# Patient Record
Sex: Female | Born: 2000 | Hispanic: Yes | Marital: Single | Smoking: Never smoker
Health system: Southern US, Community
[De-identification: ages and names within clinical notes are randomized; demographics above are authoritative.]

---
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
## 2022-01-12 DIAGNOSIS — Z419 Encounter for procedure for purposes other than remedying health state, unspecified: Secondary | ICD-10-CM | POA: Diagnosis not present

## 2022-02-12 DIAGNOSIS — Z419 Encounter for procedure for purposes other than remedying health state, unspecified: Secondary | ICD-10-CM | POA: Diagnosis not present

## 2022-03-14 DIAGNOSIS — Z419 Encounter for procedure for purposes other than remedying health state, unspecified: Secondary | ICD-10-CM | POA: Diagnosis not present

## 2022-04-03 DIAGNOSIS — H5213 Myopia, bilateral: Secondary | ICD-10-CM | POA: Diagnosis not present

## 2022-04-14 DIAGNOSIS — Z419 Encounter for procedure for purposes other than remedying health state, unspecified: Secondary | ICD-10-CM | POA: Diagnosis not present

## 2022-05-15 DIAGNOSIS — Z419 Encounter for procedure for purposes other than remedying health state, unspecified: Secondary | ICD-10-CM | POA: Diagnosis not present

## 2022-06-13 DIAGNOSIS — Z419 Encounter for procedure for purposes other than remedying health state, unspecified: Secondary | ICD-10-CM | POA: Diagnosis not present

## 2022-07-14 DIAGNOSIS — Z419 Encounter for procedure for purposes other than remedying health state, unspecified: Secondary | ICD-10-CM | POA: Diagnosis not present

## 2022-08-13 DIAGNOSIS — Z419 Encounter for procedure for purposes other than remedying health state, unspecified: Secondary | ICD-10-CM | POA: Diagnosis not present

## 2022-08-19 ENCOUNTER — Telehealth: Payer: Self-pay

## 2022-08-19 NOTE — Telephone Encounter (Signed)
Spoke with patient who declined to schedule. AS, CMA

## 2022-09-13 DIAGNOSIS — Z419 Encounter for procedure for purposes other than remedying health state, unspecified: Secondary | ICD-10-CM | POA: Diagnosis not present

## 2022-10-13 DIAGNOSIS — Z419 Encounter for procedure for purposes other than remedying health state, unspecified: Secondary | ICD-10-CM | POA: Diagnosis not present

## 2022-11-13 DIAGNOSIS — Z419 Encounter for procedure for purposes other than remedying health state, unspecified: Secondary | ICD-10-CM | POA: Diagnosis not present

## 2022-12-14 DIAGNOSIS — Z419 Encounter for procedure for purposes other than remedying health state, unspecified: Secondary | ICD-10-CM | POA: Diagnosis not present

## 2023-01-13 DIAGNOSIS — Z419 Encounter for procedure for purposes other than remedying health state, unspecified: Secondary | ICD-10-CM | POA: Diagnosis not present

## 2023-02-13 DIAGNOSIS — Z419 Encounter for procedure for purposes other than remedying health state, unspecified: Secondary | ICD-10-CM | POA: Diagnosis not present

## 2023-03-15 DIAGNOSIS — Z419 Encounter for procedure for purposes other than remedying health state, unspecified: Secondary | ICD-10-CM | POA: Diagnosis not present

## 2023-04-15 DIAGNOSIS — Z419 Encounter for procedure for purposes other than remedying health state, unspecified: Secondary | ICD-10-CM | POA: Diagnosis not present

## 2023-04-15 IMAGING — DX DG CHEST 1V PORT
1 series · 1 of 1 positions shown · non-contrast
Comparison: Chest x-ray 11/11/2020

CLINICAL DATA: Syncope

EXAM:
PORTABLE CHEST 1 VIEW

[chest]
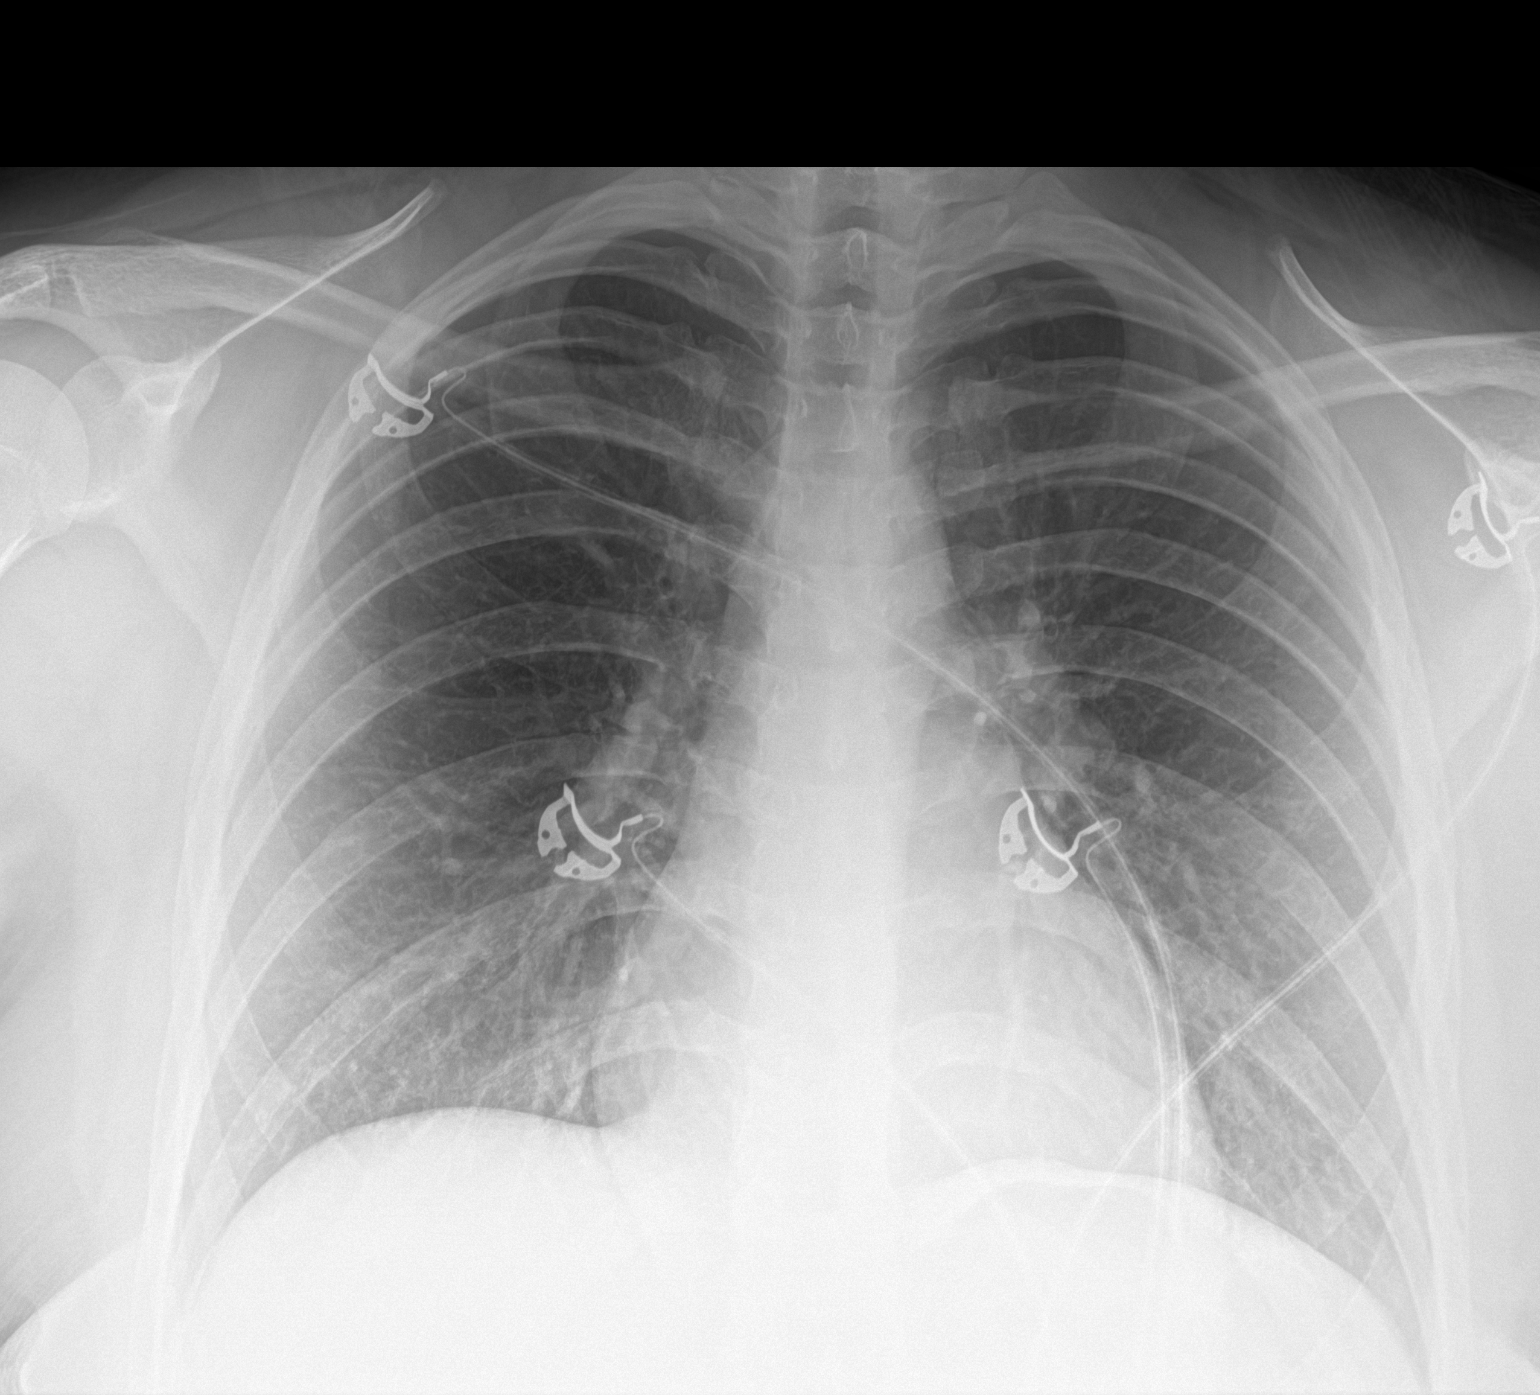

[1 of 1 positions shown; findings below may reference images not displayed]

FINDINGS: The heart and mediastinal contours are within normal limits.

No focal consolidation. No pulmonary edema. No pleural effusion. No
pneumothorax.

No acute osseous abnormality.
IMPRESSION: No active disease.

## 2023-05-16 DIAGNOSIS — Z419 Encounter for procedure for purposes other than remedying health state, unspecified: Secondary | ICD-10-CM | POA: Diagnosis not present

## 2023-06-13 DIAGNOSIS — Z419 Encounter for procedure for purposes other than remedying health state, unspecified: Secondary | ICD-10-CM | POA: Diagnosis not present

## 2023-07-25 DIAGNOSIS — Z419 Encounter for procedure for purposes other than remedying health state, unspecified: Secondary | ICD-10-CM | POA: Diagnosis not present

## 2023-08-24 DIAGNOSIS — Z419 Encounter for procedure for purposes other than remedying health state, unspecified: Secondary | ICD-10-CM | POA: Diagnosis not present

## 2023-09-24 DIAGNOSIS — Z419 Encounter for procedure for purposes other than remedying health state, unspecified: Secondary | ICD-10-CM | POA: Diagnosis not present

## 2023-10-24 DIAGNOSIS — Z419 Encounter for procedure for purposes other than remedying health state, unspecified: Secondary | ICD-10-CM | POA: Diagnosis not present

## 2023-11-11 DIAGNOSIS — L245 Irritant contact dermatitis due to other chemical products: Secondary | ICD-10-CM | POA: Diagnosis not present
